# Patient Record
Sex: Male | Born: 1990 | Race: White | Hispanic: No | Marital: Single | Smoking: Current every day smoker
Health system: Southern US, Community
[De-identification: ages and names within clinical notes are randomized; demographics above are authoritative.]

## PROBLEM LIST (undated history)

## (undated) DIAGNOSIS — F419 Anxiety disorder, unspecified: Secondary | ICD-10-CM

---
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
## 2014-08-02 ENCOUNTER — Encounter (HOSPITAL_COMMUNITY): Payer: Self-pay | Admitting: *Deleted

## 2014-08-02 ENCOUNTER — Emergency Department (HOSPITAL_COMMUNITY): Payer: Self-pay

## 2014-08-02 ENCOUNTER — Emergency Department (HOSPITAL_COMMUNITY)
Admission: EM | Admit: 2014-08-02 | Discharge: 2014-08-02 | Disposition: A | Discharge status: Home / Self Care | Payer: Self-pay | Attending: Emergency Medicine | Admitting: Emergency Medicine

## 2014-08-02 DIAGNOSIS — Z720 Tobacco use: Secondary | ICD-10-CM | POA: Insufficient documentation

## 2014-08-02 DIAGNOSIS — R0602 Shortness of breath: Secondary | ICD-10-CM | POA: Insufficient documentation

## 2014-08-02 DIAGNOSIS — R000 Tachycardia, unspecified: Secondary | ICD-10-CM | POA: Insufficient documentation

## 2014-08-02 DIAGNOSIS — Z8659 Personal history of other mental and behavioral disorders: Secondary | ICD-10-CM | POA: Insufficient documentation

## 2014-08-02 DIAGNOSIS — R0789 Other chest pain: Secondary | ICD-10-CM

## 2014-08-02 DIAGNOSIS — R109 Unspecified abdominal pain: Secondary | ICD-10-CM | POA: Insufficient documentation

## 2014-08-02 HISTORY — DX: Anxiety disorder, unspecified: F41.9

## 2014-08-02 NOTE — Discharge Instructions (Signed)
As discussed, your evaluation today has been largely reassuring.  But, it is important that you monitor your condition carefully, and do not hesitate to return to the ED if you develop new, or concerning changes in your condition. ? ?Otherwise, please follow-up with your physician for appropriate ongoing care. ? ?

## 2014-08-02 NOTE — ED Notes (Signed)
Mid chest tightness and racing heart after waking this morning at 0900.  Hx of anxiety.  Denies SOB, diaphoresis, n/v/d. Denies cough, fever, chills.

## 2014-08-02 NOTE — ED Provider Notes (Signed)
CSN: [id number]     Arrival date & time 08/02/14  1209 History  This chart was scribed for Gerhard Munchobert Ezzie Senat, MD by Budd PalmerVanessa Prueter, ED Scribe. This patient was seen in room APA11/APA11 and the patient's care was started at 12:36 PM.    Chief Complaint  Patient presents with  . chest tightness    HPI HPI Comments: Miguel Russell is a 24 y.o. male who presents to the Emergency Department complaining of constant, mild chest tightness onset this moring. Pt reports associated occasional SOB, and abdominal pain. He states that he drank alcohol, smoked, and stayed up until 5 AM this morning. He denies recent health issues. He reports a PMHx of anxiety but has not seen a physician for it. Pt denies nausea, vomiting, swelling, weight loss, confusion, and LOC.   Smoking cessation provided, particularly in light of this patient's evaluation in the ED.  Past Medical History  Diagnosis Date  . Anxiety    History reviewed. No pertinent past surgical history. History reviewed. No pertinent family history. History  Substance Use Topics  . Smoking status: Current Every Day Smoker -- 1.00 packs/day    Types: Cigarettes  . Smokeless tobacco: Not on file  . Alcohol Use: 3.6 oz/week    6 Cans of beer per week    Review of Systems  Constitutional:       Per HPI, otherwise negative  HENT:       Per HPI, otherwise negative  Respiratory: Positive for chest tightness and shortness of breath.        Per HPI, otherwise negative  Cardiovascular:       Per HPI, otherwise negative  Gastrointestinal: Positive for abdominal pain. Negative for vomiting.  Endocrine:       Negative aside from HPI  Genitourinary:       Neg aside from HPI   Musculoskeletal:       Per HPI, otherwise negative  Skin: Negative.   Neurological: Negative for syncope.    Allergies  Review of patient's allergies indicates no known allergies.  Home Medications   Prior to Admission medications   Not on File   BP 146/86 mmHg   Pulse 105  Temp(Src) 97.8 F (36.6 C) (Oral)  Resp 18  Ht 5\' 6"  (1.676 m)  SpO2 100% Physical Exam  Constitutional: He is oriented to person, place, and time. He appears well-developed. No distress.  HENT:  Head: Normocephalic and atraumatic.  Eyes: Conjunctivae and EOM are normal.  Cardiovascular: Normal rate, regular rhythm and normal heart sounds.   Pulmonary/Chest: Effort normal and breath sounds normal. No stridor. No respiratory distress.  Abdominal: He exhibits no distension.  Musculoskeletal: He exhibits no edema.  Chest TTP  Neurological: He is alert and oriented to person, place, and time.  Skin: Skin is warm and dry.  Psychiatric: He has a normal mood and affect.  Nursing note and vitals reviewed.   ED Course  Procedures  DIAGNOSTIC STUDIES: Oxygen Saturation is 100% on RA, normal by my interpretation.    COORDINATION OF CARE: 12:40 PM - Discussed plans to order EKG and chest XR.  Pt advised of plan for treatment and pt agrees.   Imaging Review Dg Chest 2 View  08/02/2014   CLINICAL DATA:  Chest pain and shortness of Breath since this morning.  EXAM: CHEST  2 VIEW  COMPARISON:  None.  FINDINGS: The heart size and mediastinal contours are within normal limits. Both lungs are clear. The visualized skeletal structures are unremarkable.  IMPRESSION: Normal chest x-ray.   Electronically Signed   By: Rudie Meyer M.D.   On: 08/02/2014 12:45     EKG Interpretation   Date/Time:  Thursday August 02 2014 12:20:24 EDT Ventricular Rate:  84 PR Interval:  127 QRS Duration: 90 QT Interval:  347 QTC Calculation: 410 R Axis:   80 Text Interpretation:  Unknown rhythm, irregular rate RSR' in V1 or V2,  right VCD or RVH Baseline wander in lead(s) V1 Sinus rhythm Artifact RSR'  pattern in V1 Abnormal ekg Confirmed by Gerhard Munch  MD [phone]) on  08/02/2014 1:36:14 PM     On repeat exam patient appears well, is smiling. He will follow-up with primary care.    MDM  I  personally performed the services described in this documentation, which was scribed in my presence. The recorded information has been reviewed and is accurate.  Well-appearing young male presents with chest pressure. Patient is awake, alert, afebrile, hemodynamically stable. Patient has mild tachycardia, those results when he has not active. Patient has minimal risk profile for ACS, EKG, chest x-ray reassuring, no evidence for infection either. Patient discharged in stable condition.  Gerhard Munch, MD 08/02/14 [phone]

## 2016-11-28 IMAGING — DX DG CHEST 2V
2 series · 2 of 2 positions shown · non-contrast
Comparison: None.

CLINICAL DATA: Chest pain and shortness of Breath since this
morning.

EXAM:
CHEST  2 VIEW

[chest lat]
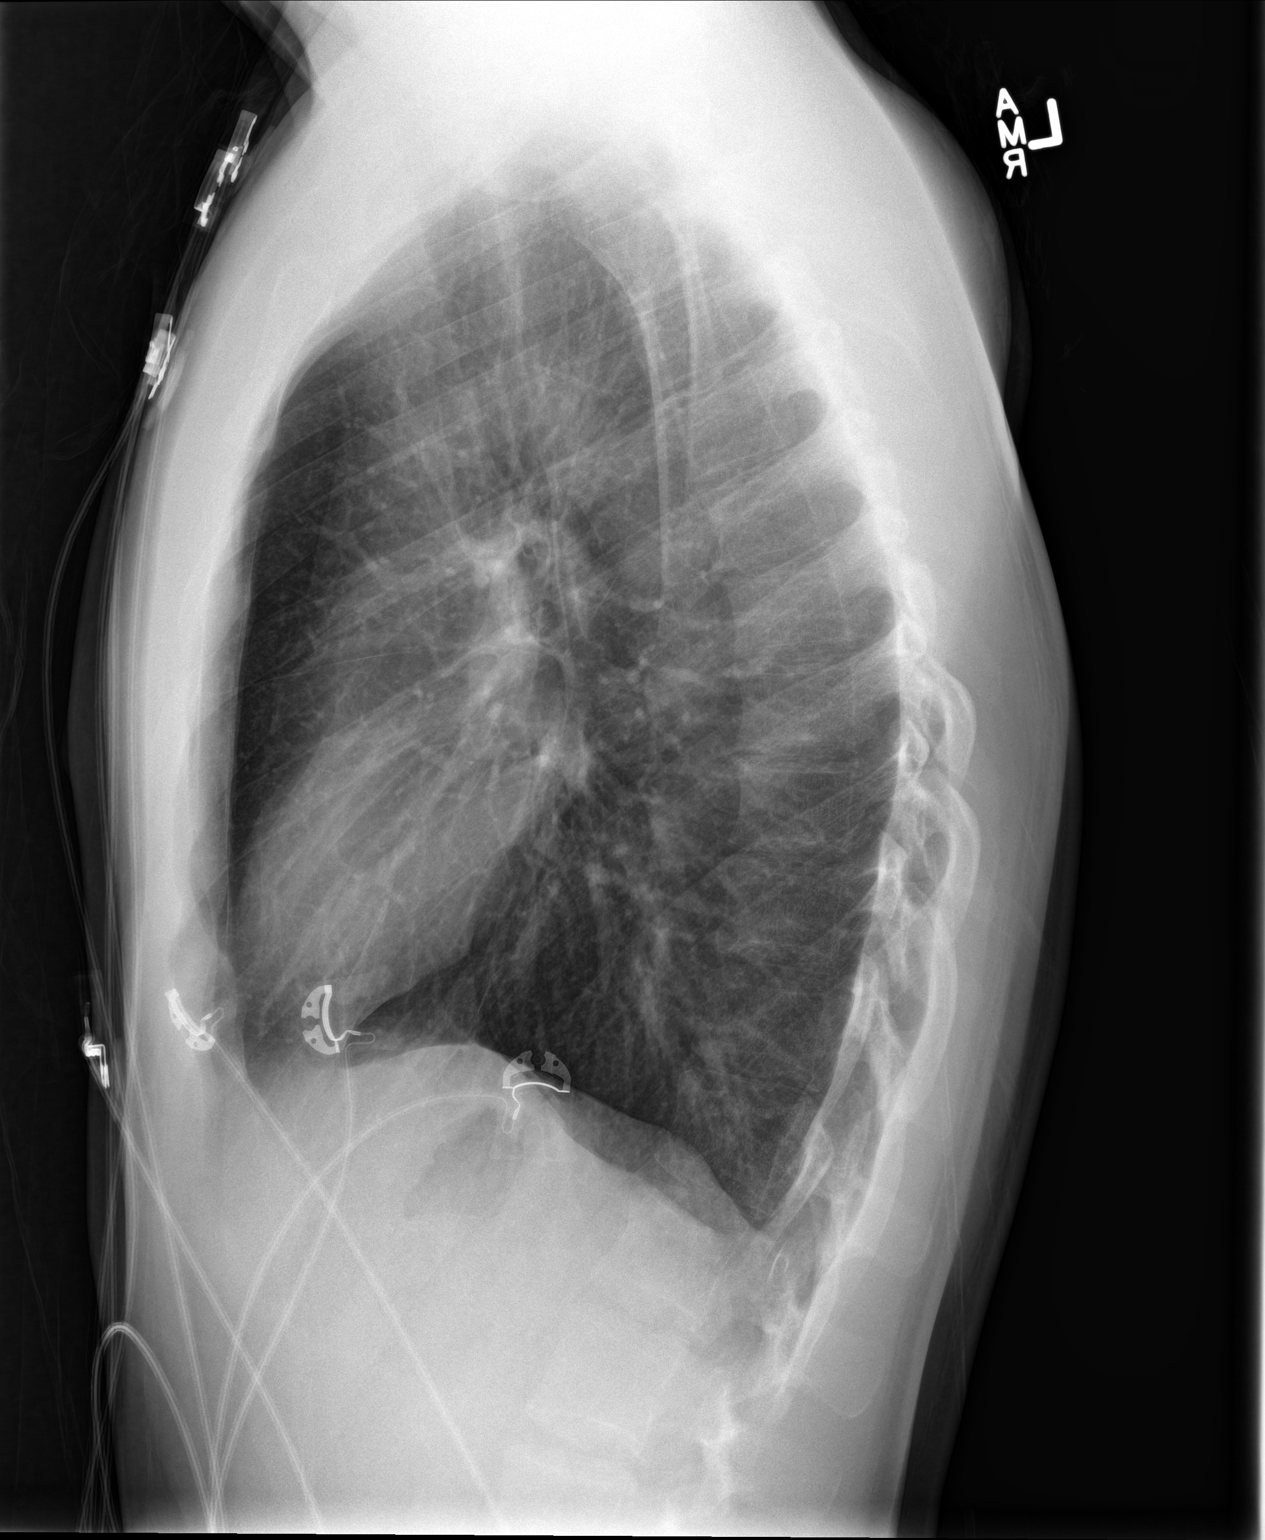

[chest pa]
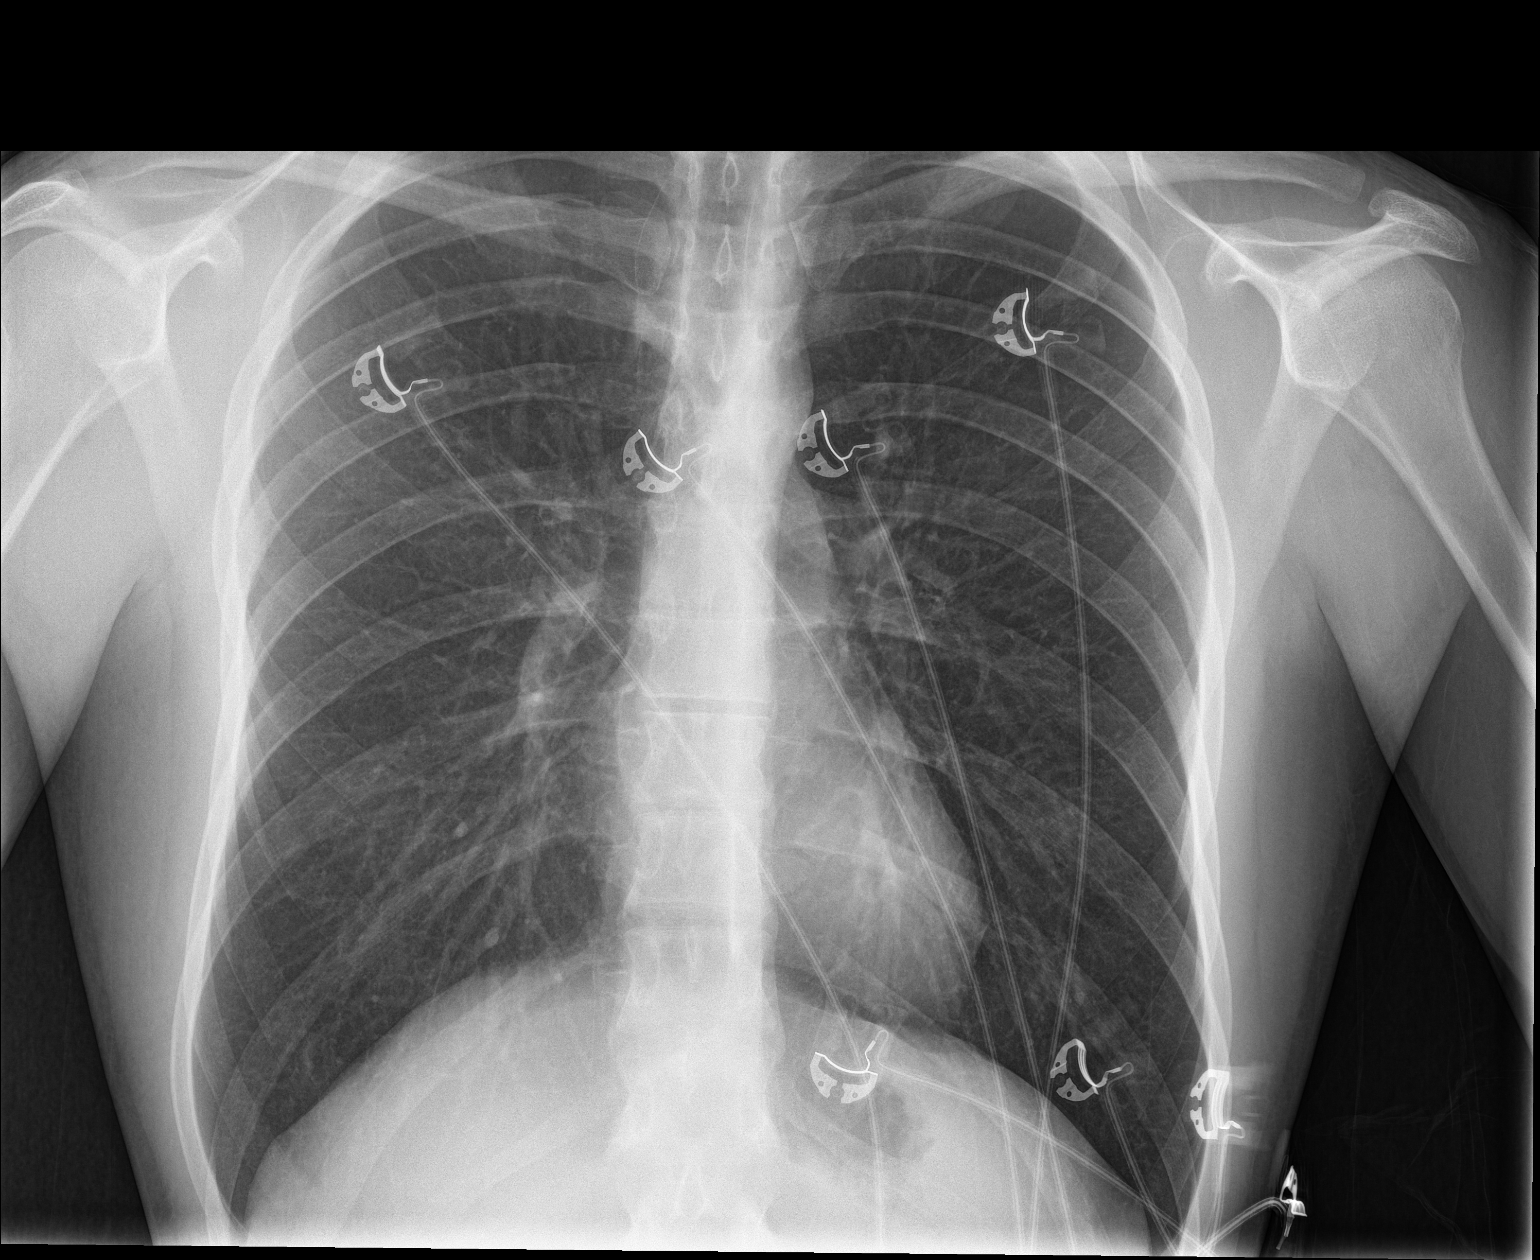

[2 of 2 positions shown; findings below may reference images not displayed]

FINDINGS: The heart size and mediastinal contours are within normal limits.
Both lungs are clear. The visualized skeletal structures are
unremarkable.
IMPRESSION: Normal chest x-ray.

## 2022-03-23 ENCOUNTER — Emergency Department (HOSPITAL_COMMUNITY)
Admission: EM | Admit: 2022-03-23 | Discharge: 2022-03-23 | Disposition: A | Discharge status: Home / Self Care | Payer: Self-pay | Attending: Emergency Medicine | Admitting: Emergency Medicine

## 2022-03-23 ENCOUNTER — Other Ambulatory Visit: Payer: Self-pay

## 2022-03-23 ENCOUNTER — Encounter (HOSPITAL_COMMUNITY): Payer: Self-pay

## 2022-03-23 ENCOUNTER — Emergency Department (HOSPITAL_COMMUNITY): Payer: Self-pay

## 2022-03-23 DIAGNOSIS — R079 Chest pain, unspecified: Secondary | ICD-10-CM | POA: Insufficient documentation

## 2022-03-23 DIAGNOSIS — R0602 Shortness of breath: Secondary | ICD-10-CM | POA: Insufficient documentation

## 2022-03-23 LAB — CBC
HCT: 49.3 % (ref 39.0–52.0)
Hemoglobin: 16.7 g/dL (ref 13.0–17.0)
MCH: 30 pg (ref 26.0–34.0)
MCHC: 33.9 g/dL (ref 30.0–36.0)
MCV: 88.7 fL (ref 80.0–100.0)
Platelets: 173 10*3/uL (ref 150–400)
RBC: 5.56 MIL/uL (ref 4.22–5.81)
RDW: 11.6 % (ref 11.5–15.5)
WBC: 4 10*3/uL (ref 4.0–10.5)
nRBC: 0 % (ref 0.0–0.2)

## 2022-03-23 LAB — BASIC METABOLIC PANEL
Anion gap: 6 (ref 5–15)
BUN: 20 mg/dL (ref 6–20)
CO2: 25 mmol/L (ref 22–32)
Calcium: 8.7 mg/dL — ABNORMAL LOW (ref 8.9–10.3)
Chloride: 105 mmol/L (ref 98–111)
Creatinine, Ser: 0.92 mg/dL (ref 0.61–1.24)
GFR, Estimated: >60 mL/min (ref >60)
Glucose, Bld: 96 mg/dL (ref 70–99)
Potassium: 4.5 mmol/L (ref 3.5–5.1)
Sodium: 136 mmol/L (ref 135–145)

## 2022-03-23 LAB — TROPONIN I (HIGH SENSITIVITY): Troponin I (High Sensitivity): <2 ng/L (ref <18)

## 2022-03-23 NOTE — ED Notes (Signed)
Pt is alert and oriented, awaiting md to visit.

## 2022-03-23 NOTE — ED Provider Notes (Signed)
[facility] Provider Note   CSN: [id number] Arrival date & time: 03/23/22  1019     History  Chief Complaint  Patient presents with   Chest Pain   Shortness of Breath    Miguel Russell is a 32 y.o. male with medical history of anxiety.  Patient presents to ED for evaluation of chest pain and shortness of breath.  Patient reports that for the last 2 months he has had chest pain and shortness of breath that will occur every other day, sometimes multiple days will pass by without an episode.  The patient states that typically the pain is located in his bilateral lateral rib area however last night pain began to occur in a centralized location, around his sternum.  The patient denies radiation of this pain however states he was short of breath when it occurred.  Patient goes on to state that he has smoked ever since he was 32 years old.  The patient states that he is not sure if this is the result of him smoking for an extended period of time or if this is a more acute cardiac event.  The patient denies any lightheadedness, dizziness, leg swelling, abdominal pain, nausea, vomiting, fevers.  Patient denies history of asthma or COPD.  Patient denies cough or wheezing.  Patient denies recent surgery or travel, history of blood clots, exogenous hormone use, one-sided leg swelling, hemoptysis.    Chest Pain Associated symptoms: shortness of breath   Associated symptoms: no abdominal pain, no cough, no dizziness, no fever, no nausea and no vomiting   Shortness of Breath Associated symptoms: chest pain   Associated symptoms: no abdominal pain, no cough, no fever, no vomiting and no wheezing        Home Medications Prior to Admission medications   Not on File      Allergies    Patient has no known allergies.    Review of Systems   Review of Systems  Constitutional:  Negative for fever.  Respiratory:  Positive for shortness of breath. Negative for  cough and wheezing.   Cardiovascular:  Positive for chest pain. Negative for leg swelling.  Gastrointestinal:  Negative for abdominal pain, nausea and vomiting.  Neurological:  Negative for dizziness, syncope and light-headedness.    Physical Exam Updated Vital Signs BP (!) 129/91 (BP Location: Right Arm)   Pulse 75   Temp 98.3 F (36.8 C) (Oral)   Resp 18   Ht '5\' 6"'$  (1.676 m)   Wt 74.8 kg   SpO2 100%   BMI 26.63 kg/m  Physical Exam Vitals and nursing note reviewed.  Constitutional:      General: He is not in acute distress.    Appearance: He is well-developed.  HENT:     Head: Normocephalic and atraumatic.     Mouth/Throat:     Mouth: Mucous membranes are moist.     Pharynx: Oropharynx is clear.  Eyes:     Conjunctiva/sclera: Conjunctivae normal.  Cardiovascular:     Rate and Rhythm: Normal rate and regular rhythm.     Heart sounds: No murmur heard. Pulmonary:     Effort: Pulmonary effort is normal. No respiratory distress.     Breath sounds: Normal breath sounds.  Abdominal:     General: Abdomen is flat. Bowel sounds are normal.     Palpations: Abdomen is soft.     Tenderness: There is no abdominal tenderness.  Musculoskeletal:  General: No swelling.     Cervical back: Neck supple.     Right lower leg: No edema.     Left lower leg: No edema.  Skin:    General: Skin is warm and dry.     Capillary Refill: Capillary refill takes less than 2 seconds.  Neurological:     Mental Status: He is alert and oriented to person, place, and time.  Psychiatric:        Mood and Affect: Mood normal.     ED Results / Procedures / Treatments   Labs (all labs ordered are listed, but only abnormal results are displayed) Labs Reviewed  BASIC METABOLIC PANEL - Abnormal; Notable for the following components:      Result Value   Calcium 8.7 (*)    All other components within normal limits  CBC  TROPONIN I (HIGH SENSITIVITY)  TROPONIN I (HIGH SENSITIVITY)    EKG EKG  Interpretation  Date/Time:  Monday March 23 2022 10:30:11 EST Ventricular Rate:  68 PR Interval:  140 QRS Duration: 78 QT Interval:  372 QTC Calculation: 395 R Axis:   74 Text Interpretation: Normal sinus rhythm Early repolarization Nonspecific ST abnormality Confirmed by Lajean Saver [phone]) on 03/23/2022 11:59:47 AM  Radiology DG Chest 2 View  Result Date: 03/23/2022 CLINICAL DATA:  Shortness of breath EXAM: CHEST - 2 VIEW COMPARISON:  CXR 08/02/14 FINDINGS: No pleural effusion. No pneumothorax. No focal airspace opacity. Normal cardiac and mediastinal contours. Vertebral body heights are maintained. No radiographically apparent displaced rib fractures. Visualized upper abdomen is unremarkable. IMPRESSION: No active cardiopulmonary disease. Electronically Signed   By: Marin Roberts M.D.   On: 03/23/2022 11:05    Procedures Procedures    Medications Ordered in ED Medications - No data to display  ED Course/ Medical Decision Making/ A&P          HEART Score: 1                Medical Decision Making Amount and/or Complexity of Data Reviewed Labs: ordered. Radiology: ordered.   32 year old male presents to the ED for evaluation.  Please see HPI for further details.  On examination the patient is afebrile and nontachycardic.  Patient lung sounds are clear bilaterally, he is not hypoxic on room air.  Abdomen is soft and compressible throughout.  Neurological exam without focal neurodeficits.  No peripheral edema.  Patient nontoxic in appearance sitting comfortably in bed.  Workup will include CBC, BMP, troponin, EKG, chest x-ray.  Doubt PE as the patient is PERC arteria negative.  CBC unremarkable without leukocytosis or anemia.  The patient BMP is unremarkable without electrolyte derangement.  The patient troponin is less than 2, no delta will be collected due to the fact the patient denies any chest pain at this time.  Patient heart score 1.  Patient chest x-ray shows no  consolidations or effusions.  Patient EKG is normal sinus rhythm.  Doubt ACS at this time.  Patient will be referred to PCP at this time for further management.  The patient was provided return precautions and he voiced understanding.  The patient had all his questions answered to his satisfaction.  The patient is stable at this time for discharge home.   Final Clinical Impression(s) / ED Diagnoses Final diagnoses:  Chest pain, unspecified type    Rx / DC Orders ED Discharge Orders     None         Azucena Cecil, PA-C 03/23/22 1230  Lajean Saver, MD 03/26/22 [phone]

## 2022-03-23 NOTE — ED Triage Notes (Signed)
Pt states that for the last few weeks he's been having intermittent rib cage pain bilaterally. Last night pt began having midsternal non-radiating CP with SOB that has currently resolved.

## 2022-03-23 NOTE — Discharge Instructions (Addendum)
Return to the ED with any new or worsening signs or symptoms Please follow-up with primary care.  Please call and make an appointment be seen. Please read the attached guide concerning nonspecific chest pain Please read the attached guide on steps to quit smoking
